# Patient Record
Sex: Female | Born: 2005 | Hispanic: Yes | Marital: Single | Smoking: Never smoker
Health system: Southern US, Community
[De-identification: ages and names within clinical notes are randomized; demographics above are authoritative.]

---
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
## 2023-10-12 ENCOUNTER — Ambulatory Visit (INDEPENDENT_AMBULATORY_CARE_PROVIDER_SITE_OTHER): Payer: Medicaid Other

## 2023-10-12 VITALS — HR 123 | Temp 98.4°F | Wt 157.0 lb

## 2023-10-12 DIAGNOSIS — J069 Acute upper respiratory infection, unspecified: Secondary | ICD-10-CM | POA: Diagnosis not present

## 2023-10-12 LAB — POC SOFIA 2 FLU + SARS ANTIGEN FIA
Influenza A, POC: NEGATIVE
Influenza B, POC: NEGATIVE
SARS Coronavirus 2 Ag: NEGATIVE

## 2023-10-12 NOTE — Progress Notes (Addendum)
Pediatric Acute Care Visit  PCP: Marita Kansas, MD (Inactive)   Chief Complaint  Patient presents with   Sore Throat    Started last week   Chills    Started Friday, really hot but chilly at the same time, last a few hours   Nasal Congestion    Last week    Subjective:  HPI:  Kathryn Davila is a 17 y.o. female presenting with one week history of cough, nasal congestion, and several day history of chills at home.  Nasal congestion and cough started last Tuesday.  She is coughing up phlegm and feels mucous in her nose and throat.  Cough was initially worse but has improved the past few days.  The nasal congestion has persisted.  Chills started on Friday.  Friday to Sunday were the worst days of her illness so far (yesterday and the day before).  The chills lasted for several hours and would occur once per day Friday through Sunday.  She does not have a thermometer at home so was unable to take her temperature.  During the chills, she was sweating and her head felt warm and her body felt cold.  She had a sore throat earlier in the week but it has resolved.  It doesn't hurt to swallow anymore.  One day over the weekend, she had several hours of chest tightness that got better with coughing.  No chest tightness or chest pain since then.  She took ibuprofen once over the weekend for a headache.  She also took dayquil once earlier in the week.  No other OTC meds.  Friday-Sunday she couldn't taste food but now she can.  She is eating and drinking normally.  Today, she has had 3 cups of water.  No vomiting or diarrhea.  No dysuria.  No new rashes.  She didn't work this weekend.  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  No known sick contacts.  No significant PMH.  No meds on a regular basis.  Meds: No current outpatient medications on file.   No current facility-administered medications for this visit.    ALLERGIES: No Known Allergies  Past medical, surgical, social, family history  reviewed as well as allergies and medications and updated as needed.  Objective:   Physical Examination:  Temp: 98.4 F (36.9 C) (Oral) Pulse: (!) 123 repeat- 114 on re-check (patient reported feeling anxious) Wt: 157 lb (71.2 kg)   General: Awake, alert, appropriately responsive in NAD HEENT: NCAT. EOMI, PERRL, clear sclera and conjunctiva, corneal light reflex symmetric. TM's clear bilaterally, non-bulging. Nasal congestion present. Oropharynx clear with no tonsillar enlargment or exudates. MMM. Neck: Supple. Normal range of motion.  No nuchal rigidity. Lymph Nodes: Palpable pea-sized anterior cervical LAD. CV: Tachycardic, regular rhythm, normal S1, S2. No murmur appreciated. 2+ distal pulses.  Pulm: Normal WOB. CTAB with good aeration throughout.  No focal W/R/R.  Abd: Normoactive bowel sounds. Soft, non-tender, non-distended. MSK: Extremities WWP. Moves all extremities equally.  Neuro: Appropriately responsive to stimuli. Normal bulk and tone. No gross deficits appreciated. Skin: No rashes or lesions appreciated. Cap refill < 2 seconds.   Assessment/Plan:   Kathryn Davila is a 17 y.o. old female presenting with one week history of cough, nasal congestion, loss of taste last week and several day history of chills at home over the weekend (without knowledge of fevers (no thermometer).  1. Viral URI with cough Patient afebrile and overall well appearing today. Physical examination benign with no evidence of  meningismus on examination. Lungs CTAB without focal evidence of pneumonia. No concern for urinary tract infection due to no urinary symptoms.  Patient tachycardic on exam (123 and then 114 on re-check). No chest pain less concern for pericarditis.  No shortness of breath and overall well appear other than nasal congestion.  When discussing more with patient, she says she does get anxious and this may be contributing to her heart rate.  She is well-hydrated, drank 3 cups of water today, and has  brisk cap refill.  She is currently afebrile and last feeling of fever was yesterday (making sepsis unlikely).  Tachycardia likely due to anxiety.  Did advise patient to wear her smart watch tonight and check her heart rate a few times when she is feeling more relaxed- patient agreed to do this. Nasal congestion and cough symptoms likely secondary viral URI. Covid and flu test negative in office today, but covid with high probability with recent symptoms including loss of taste- (possible false negative as she is about 6 days into this illness).  Counseled to take OTC (tylenol, motrin) as needed for symptomatic treatment of fever.  Provided patient with thermometer so she can check her temperature at home.  Can also use honey to help with cough.  Also counseled regarding importance of hydration. School note provided. Counseled to return to clinic if fever persists, if she has worsening headache, develops chest pain, or has persistent sinus symptoms lasting longer than 2 weeks.  - POC SOFIA 2 FLU + SARS ANTIGEN FIA   Decisions were made and discussed with caregiver who was in agreement.   Marc Morgans, MD  [facility]
